# Patient Record
Sex: Female | Born: 1985 | Race: White | Hispanic: No | Marital: Single | Smoking: Current every day smoker
Health system: Southern US, Community
[De-identification: ages and names within clinical notes are randomized; demographics above are authoritative.]

---

## 2013-07-09 ENCOUNTER — Emergency Department (HOSPITAL_COMMUNITY)
Admission: EM | Admit: 2013-07-09 | Discharge: 2013-07-09 | Disposition: A | Discharge status: Home / Self Care | Payer: BC Managed Care – PPO | Attending: Emergency Medicine | Admitting: Emergency Medicine

## 2013-07-09 ENCOUNTER — Emergency Department (HOSPITAL_COMMUNITY): Payer: BC Managed Care – PPO

## 2013-07-09 ENCOUNTER — Encounter (HOSPITAL_COMMUNITY): Payer: Self-pay | Admitting: *Deleted

## 2013-07-09 DIAGNOSIS — S6990XA Unspecified injury of unspecified wrist, hand and finger(s), initial encounter: Secondary | ICD-10-CM | POA: Insufficient documentation

## 2013-07-09 DIAGNOSIS — S59909A Unspecified injury of unspecified elbow, initial encounter: Secondary | ICD-10-CM | POA: Insufficient documentation

## 2013-07-09 DIAGNOSIS — W010XXA Fall on same level from slipping, tripping and stumbling without subsequent striking against object, initial encounter: Secondary | ICD-10-CM | POA: Insufficient documentation

## 2013-07-09 DIAGNOSIS — M25522 Pain in left elbow: Secondary | ICD-10-CM

## 2013-07-09 DIAGNOSIS — Z79899 Other long term (current) drug therapy: Secondary | ICD-10-CM | POA: Insufficient documentation

## 2013-07-09 DIAGNOSIS — Y939 Activity, unspecified: Secondary | ICD-10-CM | POA: Insufficient documentation

## 2013-07-09 DIAGNOSIS — Y929 Unspecified place or not applicable: Secondary | ICD-10-CM | POA: Insufficient documentation

## 2013-07-09 MED ORDER — TRAMADOL HCL 50 MG PO TABS: 50.0000 mg | ORAL_TABLET | Freq: Four times a day (QID) | ORAL | Status: DC | PRN

## 2013-07-09 MED ORDER — KETOROLAC TROMETHAMINE 60 MG/2ML IM SOLN
60.0000 mg | Freq: Once | INTRAMUSCULAR | Status: AC
  Administered 2013-07-09: 60 mg via INTRAMUSCULAR
  Filled 2013-07-09: qty 2

## 2013-07-09 NOTE — ED Provider Notes (Signed)
Medical screening examination/treatment/procedure(s) were performed by non-physician practitioner and as supervising physician I was immediately available for consultation/collaboration.   Charles B. Sheldon, MD 07/09/13 2010 

## 2013-07-09 NOTE — ED Provider Notes (Signed)
CSN: [id number]     Arrival date & time 07/09/13  1145 History   First MD Initiated Contact with Patient 07/09/13 1313     Chief Complaint  Patient presents with  . Elbow Pain   (Consider location/radiation/quality/duration/timing/severity/associated sxs/prior Treatment) HPI Comments: Patient is a 27 year old female presented to emergency department for moderate sharp non-radiating left elbow pain that occurred Saturday evening after she slipped falling and landing on her elbow. Patient states she did immediate pain, bruising, and swelling the swelling and bruising have improved since Saturday. Patient states her elbow is continuing to bother her. States pain exacerbated with extension and flexion of arm. No alleviating factors. Denies numbness or tingling in arm.    History reviewed. No pertinent past medical history. No past surgical history on file. No family history on file. History  Substance Use Topics  . Smoking status: Not on file  . Smokeless tobacco: Not on file  . Alcohol Use: Not on file   OB History   Grav Para Term Preterm Abortions TAB SAB Ect Mult Living                 Review of Systems  Constitutional: Negative for fever and chills.  HENT: Negative for neck pain.   Musculoskeletal: Positive for myalgias, joint swelling and arthralgias.  Skin: Negative for wound.    Allergies  Review of patient's allergies indicates no known allergies.  Home Medications   Current Outpatient Rx  Name  Route  Sig  Dispense  Refill  . ibuprofen (ADVIL,MOTRIN) 200 MG tablet   Oral   Take 600 mg by mouth every 8 (eight) hours as needed for pain.         Marland Kitchen lisdexamfetamine (VYVANSE) 30 MG capsule   Oral   Take 30 mg by mouth every morning.         . traMADol (ULTRAM) 50 MG tablet   Oral   Take 1 tablet (50 mg total) by mouth every 6 (six) hours as needed for pain.   10 tablet   0    BP 90/60  Pulse 93  Temp(Src) 97.9 F (36.6 C) (Oral)  Resp 16  SpO2 100%   LMP 07/03/2013 Physical Exam  Constitutional: She is oriented to person, place, and time. She appears well-developed and well-nourished. No distress.  HENT:  Head: Normocephalic and atraumatic.  Right Ear: External ear normal.  Left Ear: External ear normal.  Nose: Nose normal.  Eyes: Conjunctivae are normal.  Neck: Neck supple.  Cardiovascular: Intact distal pulses.   Pulmonary/Chest: Effort normal.  Musculoskeletal:       Left elbow: She exhibits decreased range of motion and swelling. She exhibits no effusion, no deformity and no laceration. Tenderness found.       Left wrist: Normal.       Left forearm: Normal.       Left hand: Normal. Normal sensation noted. Normal strength noted.  Cap refill < 3 seconds  Neurological: She is alert and oriented to person, place, and time.  Skin: Skin is warm and dry. She is not diaphoretic.  Psychiatric: She has a normal mood and affect.    ED Course  Procedures (including critical care time)  Medications  ketorolac (TORADOL) injection 60 mg (60 mg Intramuscular Given 07/09/13 1351)    Labs Review Labs Reviewed - No data to display Imaging Review Dg Elbow Complete Left  07/09/2013   CLINICAL DATA:  Pain post trauma  EXAM: LEFT ELBOW - COMPLETE 3+ VIEW  COMPARISON:  None.  FINDINGS: Frontal, lateral, and bilateral oblique views were obtained. There is no fracture, dislocation, or effusion. Joint spaces appear intact. No erosive change.  IMPRESSION: No abnormality noted.   Electronically Signed   By: Bretta Bang   On: 07/09/2013 13:21    MDM   1. Left elbow pain     Afebrile, NAD, non-toxic appearing, AAOx4. Neurovascularly intact. No sensory deficit. Range of motion intact Imaging shows no fracture. Directed pt to ice injury, take acetaminophen or ibuprofen for pain, and to elevate and rest the injury when possible. RICE protocol discussed. Advised f/u in 1 week with PCP if not improving. Return precautions discussed. Patient is  agreeable to plan. Patient is stable at time of discharge       Jeannetta Ellis, PA-C 07/09/13 1836

## 2013-07-09 NOTE — ED Notes (Signed)
Pt reports she fell on Saturday and hurt her left elbow. Able to extend arm and wiggle fingers. Pain with movement 7/10.

## 2013-07-09 NOTE — Discharge Instructions (Signed)
Please follow up with your primary care physician in 1-2 days. Please take pain medication as prescribed and as needed for pain. Please do not drive on narcotic pain medication. Please follow RICE method below. Please read all discharge instructions and return precautions.   Elbow Contusion An elbow contusion is a deep bruise of the elbow. Contusions are the result of an injury that caused bleeding under the skin. The contusion may turn blue, purple, or yellow. Minor injuries will give you a painless contusion, but more severe contusions may stay painful and swollen for a few weeks.  CAUSES  An elbow contusion comes from a direct force to that area, such as falling on the elbow. SYMPTOMS   Swelling and redness of the elbow.  Bruising of the elbow area.  Tenderness or soreness of the elbow. DIAGNOSIS  You will have a physical exam and will be asked about your history. You may need an X-ray of your elbow to look for a broken bone (fracture).  TREATMENT  A sling or splint may be needed to support your injury. Resting, elevating, and applying cold compresses to the elbow area are often the best treatments for an elbow contusion. Over-the-counter medicines may also be recommended for pain control. HOME CARE INSTRUCTIONS   Put ice on the injured area.  Put ice in a plastic bag.  Place a towel between your skin and the bag.  Leave the ice on for 15-20 minutes, 3-4 times a day.  Only take over-the-counter or prescription medicines for pain, discomfort, or fever as directed by your caregiver.  Rest your injured elbow until the pain and swelling are better.  Elevate your elbow to reduce swelling.  Apply a compression wrap as directed by your caregiver. This can help reduce swelling and motion. You may remove the wrap for sleeping, showers, and baths. If your fingers become numb, cold, or blue, take the wrap off and reapply it more loosely.  Use your elbow only as directed by your caregiver.  You may be asked to do range of motion exercises. Do them as directed.  See your caregiver as directed. It is very important to keep all follow-up appointments in order to avoid any long-term problems with your elbow, including chronic pain or inability to move your elbow normally. SEEK IMMEDIATE MEDICAL CARE IF:   You have increased redness, swelling, or pain in your elbow.  Your swelling or pain is not relieved with medicines.  You have swelling of the hand and fingers.  You are unable to move your fingers or wrist.  You begin to lose feeling in your hand or fingers.  Your fingers or hand become cold or blue. MAKE SURE YOU:   Understand these instructions.  Will watch your condition.  Will get help right away if you are not doing well or get worse. Document Released: 09/19/2006 Document Revised: 01/03/2012 Document Reviewed: 08/27/2011 [facility] Patient Information 2014 [place], [place].   RICE: Routine Care for Injuries The routine care of many injuries includes Rest, Ice, Compression, and Elevation (RICE). HOME CARE INSTRUCTIONS  Rest is needed to allow your body to heal. Routine activities can usually be resumed when comfortable. Injured tendons and bones can take up to 6 weeks to heal. Tendons are the cord-like structures that attach muscle to bone.  Ice following an injury helps keep the swelling down and reduces pain.  Put ice in a plastic bag.  Place a towel between your skin and the bag.  Leave the ice on  for 15-20 minutes, 3-4 times a day. Do this while awake, for the first 24 to 48 hours. After that, continue as directed by your caregiver.  Compression helps keep swelling down. It also gives support and helps with discomfort. If an elastic bandage has been applied, it should be removed and reapplied every 3 to 4 hours. It should not be applied tightly, but firmly enough to keep swelling down. Watch fingers or toes for swelling, bluish discoloration, coldness,  numbness, or excessive pain. If any of these problems occur, remove the bandage and reapply loosely. Contact your caregiver if these problems continue.  Elevation helps reduce swelling and decreases pain. With extremities, such as the arms, hands, legs, and feet, the injured area should be placed near or above the level of the heart, if possible. SEEK IMMEDIATE MEDICAL CARE IF:  You have persistent pain and swelling.  You develop redness, numbness, or unexpected weakness.  Your symptoms are getting worse rather than improving after several days. These symptoms may indicate that further evaluation or further X-rays are needed. Sometimes, X-rays may not show a small broken bone (fracture) until 1 week or 10 days later. Make a follow-up appointment with your caregiver. Ask when your X-ray results will be ready. Make sure you get your X-ray results. Document Released: 01/23/2001 Document Revised: 01/03/2012 Document Reviewed: 03/12/2011 [facility] Patient Information 2014 [place], [place].

## 2013-07-09 NOTE — Progress Notes (Signed)
Pcp is [facility] on [address] [place] including Dr Janace Litten, scott, reade etc EPIC updated

## 2014-07-26 IMAGING — CR DG ELBOW COMPLETE 3+V*L*
4 series · 4 of 4 positions shown · non-contrast
Comparison: None.

CLINICAL DATA: Pain post trauma

EXAM:
LEFT ELBOW - COMPLETE 3+ VIEW

[x elbow lat left]
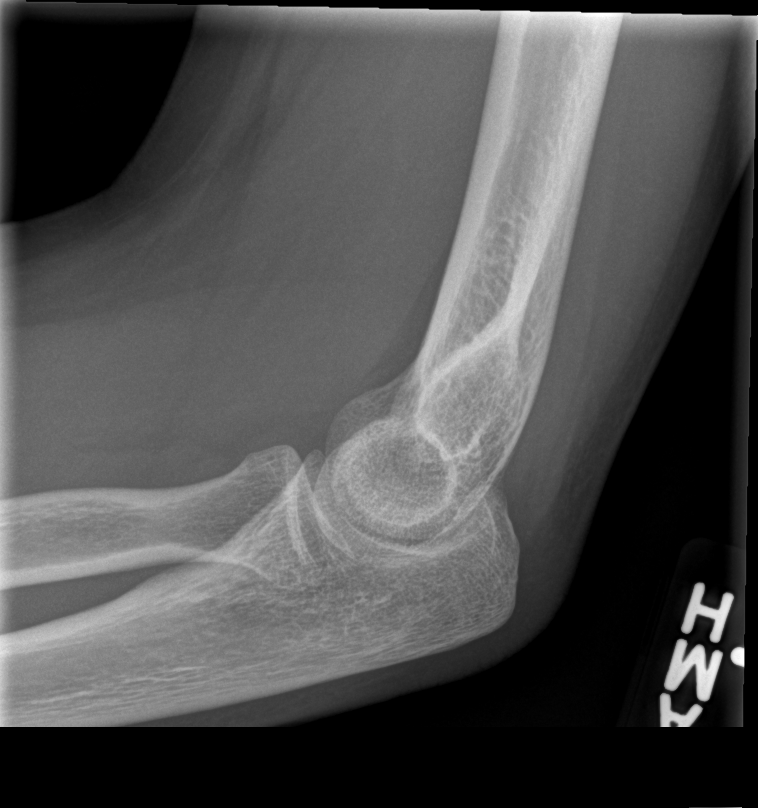

[x elbow ap left]
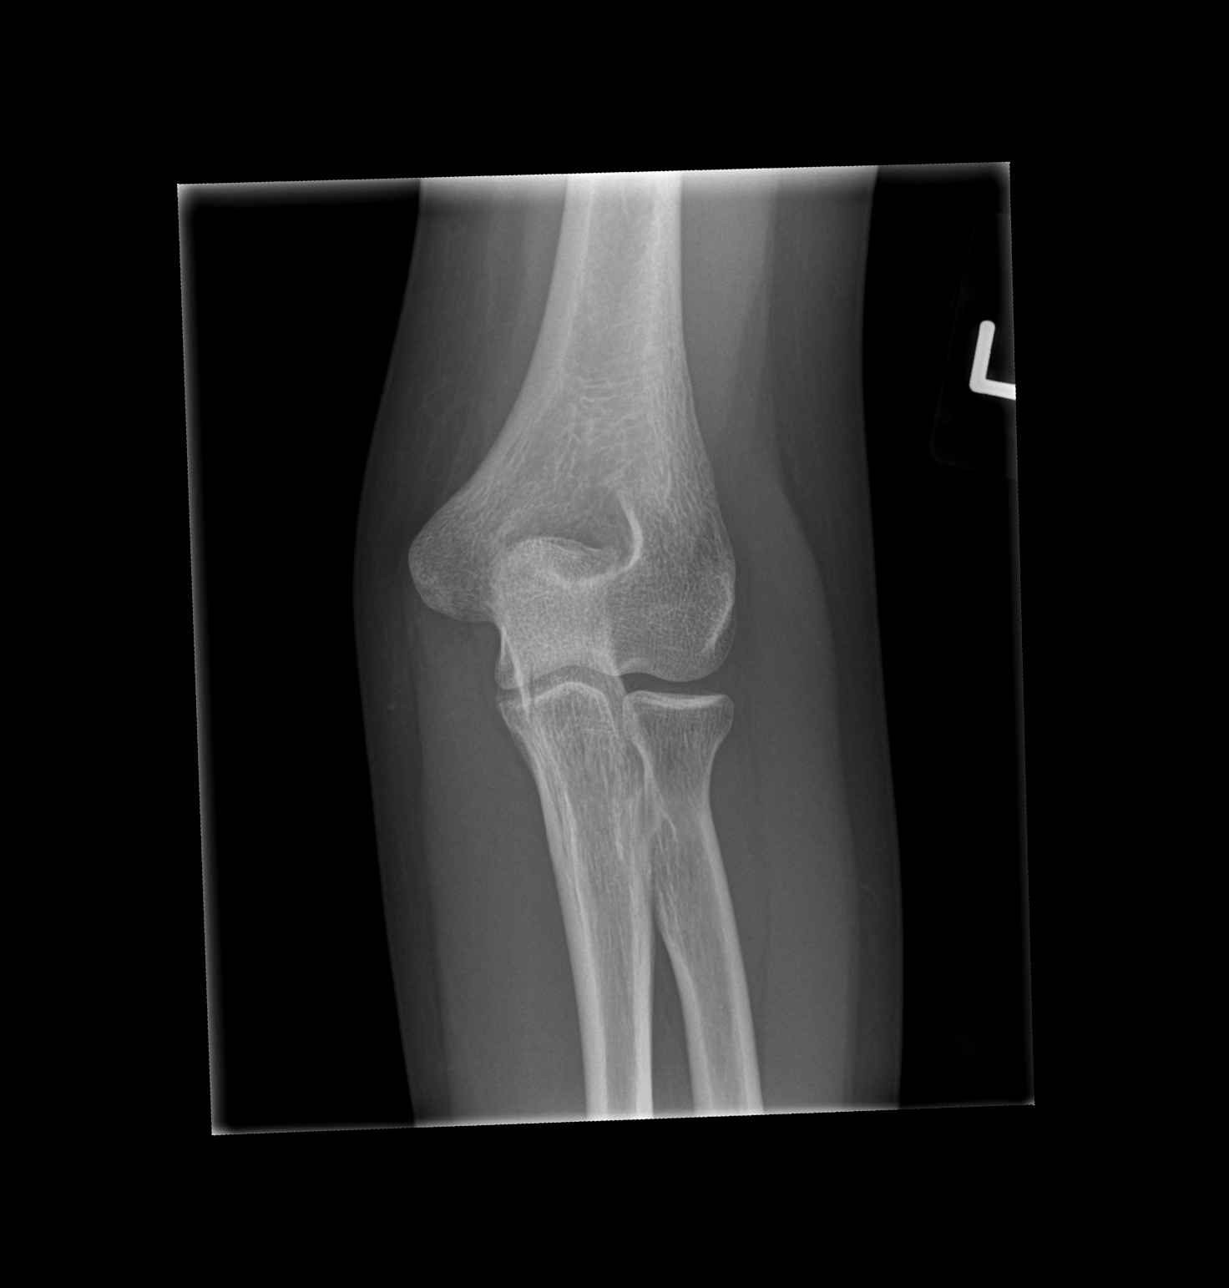

[x elbow obl left (1 of 2)]
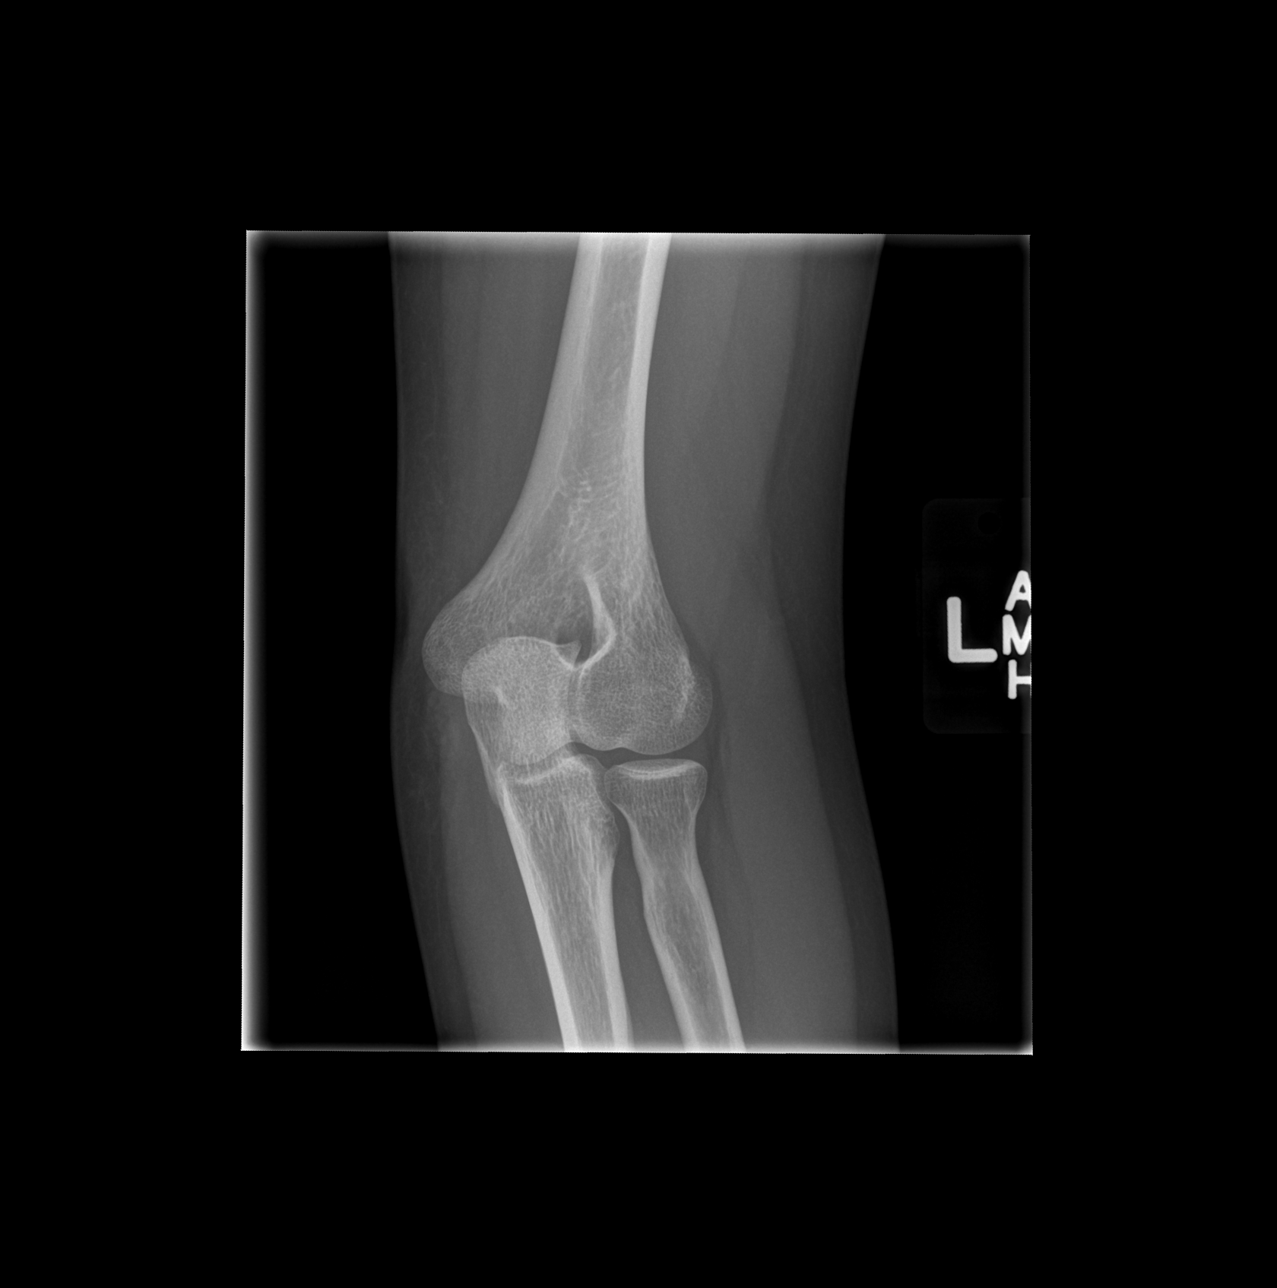

[x elbow obl left (2 of 2)]
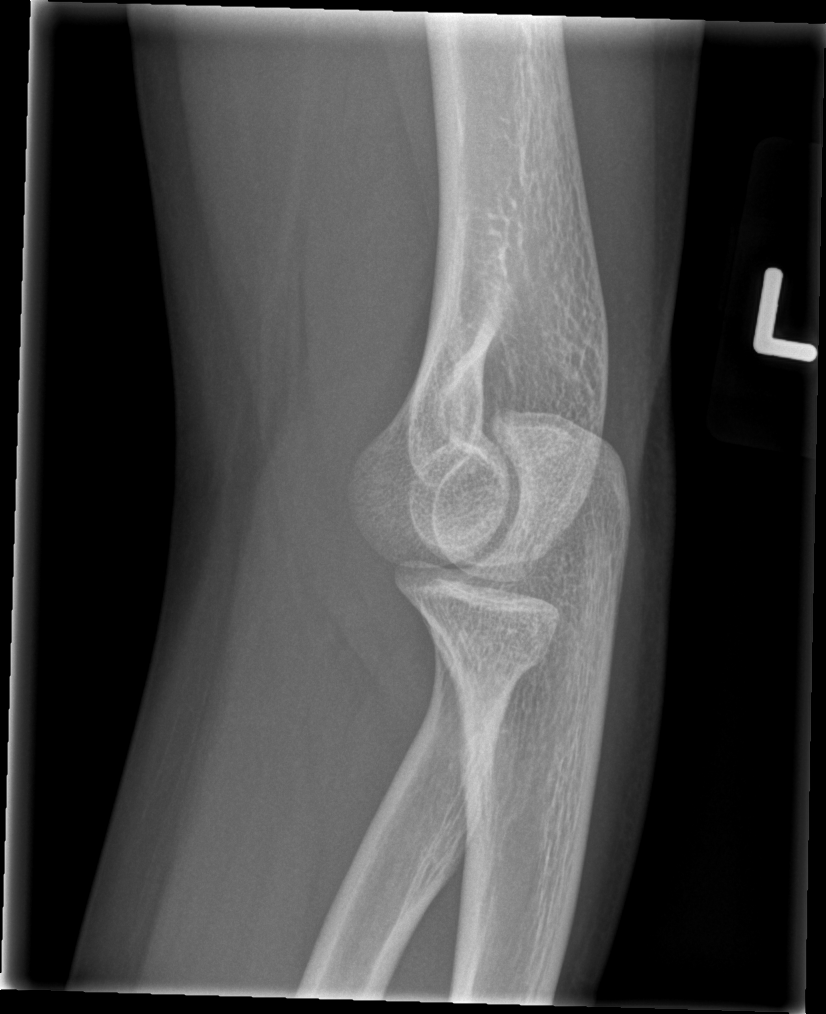

[4 of 4 positions shown; findings below may reference images not displayed]

FINDINGS: Frontal, lateral, and bilateral oblique views were obtained. There
is no fracture, dislocation, or effusion. Joint spaces appear
intact. No erosive change.
IMPRESSION: No abnormality noted.

## 2015-11-19 ENCOUNTER — Encounter: Payer: Self-pay | Admitting: Women's Health

## 2015-11-19 ENCOUNTER — Ambulatory Visit (INDEPENDENT_AMBULATORY_CARE_PROVIDER_SITE_OTHER): Payer: BLUE CROSS/BLUE SHIELD | Admitting: Women's Health

## 2015-11-19 ENCOUNTER — Other Ambulatory Visit (HOSPITAL_COMMUNITY)
Admission: RE | Admit: 2015-11-19 | Discharge: 2015-11-19 | Disposition: A | Discharge status: Home / Self Care | Payer: BLUE CROSS/BLUE SHIELD | Source: Ambulatory Visit | Attending: Gynecology | Admitting: Gynecology

## 2015-11-19 VITALS — BP 118/80 | Ht 66.0 in | Wt 157.0 lb

## 2015-11-19 DIAGNOSIS — B009 Herpesviral infection, unspecified: Secondary | ICD-10-CM

## 2015-11-19 DIAGNOSIS — N760 Acute vaginitis: Secondary | ICD-10-CM | POA: Diagnosis not present

## 2015-11-19 DIAGNOSIS — Z01419 Encounter for gynecological examination (general) (routine) without abnormal findings: Secondary | ICD-10-CM | POA: Diagnosis not present

## 2015-11-19 DIAGNOSIS — N898 Other specified noninflammatory disorders of vagina: Secondary | ICD-10-CM | POA: Diagnosis not present

## 2015-11-19 DIAGNOSIS — A499 Bacterial infection, unspecified: Secondary | ICD-10-CM | POA: Diagnosis not present

## 2015-11-19 DIAGNOSIS — B9689 Other specified bacterial agents as the cause of diseases classified elsewhere: Secondary | ICD-10-CM

## 2015-11-19 LAB — WET PREP FOR TRICH, YEAST, CLUE
Trich, Wet Prep: NONE SEEN
Yeast Wet Prep HPF POC: NONE SEEN

## 2015-11-19 MED ORDER — VALACYCLOVIR HCL 500 MG PO TABS: ORAL_TABLET | ORAL | Status: AC

## 2015-11-19 MED ORDER — METRONIDAZOLE 500 MG PO TABS: 500.0000 mg | ORAL_TABLET | Freq: Two times a day (BID) | ORAL | Status: DC

## 2015-11-19 NOTE — Progress Notes (Signed)
Alison Day 11/16/1985 [id number]    History:    Presents for annual exam.  Monthly 5-6 day cycles for several days heavy. Has used oral contraceptives in the past would like to do something nonhormonal or an IUD. Normal Pap history. Received gardasil. One partner for past year with a negative STD screen. History of HSV 2 rare outbreaks.  Past medical history, past surgical history, family history and social history were all reviewed and documented in the EPIC chart. 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 Parents healthy.  ROS:  A ROS was performed and pertinent positives and negatives are included.  Exam:  Filed Vitals:   11/19/15 1056  BP: 118/80    General appearance:  Normal Thyroid:  Symmetrical, normal in size, without palpable masses or nodularity. Respiratory  Auscultation:  Clear without wheezing or rhonchi Cardiovascular  Auscultation:  Regular rate, without rubs, murmurs or gallops  Edema/varicosities:  Not grossly evident Abdominal  Soft,nontender, without masses, guarding or rebound.  Liver/spleen:  No organomegaly noted  Hernia:  None appreciated  Skin  Inspection:  Grossly normal numerous tattoos   Breasts: Examined lying and sitting.     Right: Without masses, retractions, discharge or axillary adenopathy.     Left: Without masses, retractions, discharge or axillary adenopathy. Gentitourinary   Inguinal/mons:  Normal without inguinal adenopathy  External genitalia:  Normal  BUS/Urethra/Skene's glands:  Normal  Vagina:  White discharge wet prep positive for moderate clues, TNTC bacteria  Cervix:  Normal  Uterus:  normal in size, shape and contour.  Midline and mobile  Adnexa/parametria:     Rt: Without masses or tenderness.   Lt: Without masses or tenderness.  Anus and perineum: Normal  Digital rectal exam: Normal sphincter tone without palpated masses or tenderness  Assessment/Plan:  30 y.o. S WF G1 P0 for  annual exam.   Contraception management HSV-2 history Bacteria vaginosis  Plan: Contraception options reviewed, would like to try Mirena IUD, will check coverage, schedule with next cycle with Dr. Lily Peer for placement. Reviewed slight risks of infection, perforation and hemorrhage. Continue condoms until IUD placed. Valtrex 500 twice daily for 3-5 days when necessary prescription, proper use given and reviewed. Flagyl 500 twice daily for 7 days, alcohol precautions reviewed. Instructed to call if no relief of discharge. SBE's, regular exercise, calcium rich diet, MVI daily encouraged. At this time desires no children. CBC, UA, Pap.    Harrington Challenger [facility], 11:48 AM 11/19/2015

## 2015-11-19 NOTE — Addendum Note (Signed)
Addended by: Kem Parkinson on: 11/19/2015 12:11 PM   Modules accepted: Orders

## 2015-11-19 NOTE — Patient Instructions (Signed)
Levonorgestrel intrauterine device (IUD) What is this medicine? LEVONORGESTREL IUD (LEE voe nor jes trel) is a contraceptive (birth control) device. The device is placed inside the uterus by a healthcare professional. It is used to prevent pregnancy and can also be used to treat heavy bleeding that occurs during your period. Depending on the device, it can be used for 3 to 5 years. This medicine may be used for other purposes; ask your health care provider or pharmacist if you have questions. What should I tell my health care provider before I take this medicine? They need to know if you have any of these conditions: -abnormal Pap smear -cancer of the breast, uterus, or cervix -diabetes -endometritis -genital or pelvic infection now or in the past -have more than one sexual partner or your partner has more than one partner -heart disease -history of an ectopic or tubal pregnancy -immune system problems -IUD in place -liver disease or tumor -problems with blood clots or take blood-thinners -use intravenous drugs -uterus of unusual shape -vaginal bleeding that has not been explained -an unusual or allergic reaction to levonorgestrel, other hormones, silicone, or polyethylene, medicines, foods, dyes, or preservatives -pregnant or trying to get pregnant -breast-feeding How should I use this medicine? This device is placed inside the uterus by a health care professional. Talk to your pediatrician regarding the use of this medicine in children. Special care may be needed. Overdosage: If you think you have taken too much of this medicine contact a poison control center or emergency room at once. NOTE: This medicine is only for you. Do not share this medicine with others. What if I miss a dose? This does not apply. What may interact with this medicine? Do not take this medicine with any of the following medications: -amprenavir -bosentan -fosamprenavir This medicine may also interact with  the following medications: -aprepitant -barbiturate medicines for inducing sleep or treating seizures -bexarotene -griseofulvin -medicines to treat seizures like carbamazepine, ethotoin, felbamate, oxcarbazepine, phenytoin, topiramate -modafinil -pioglitazone -rifabutin -rifampin -rifapentine -some medicines to treat HIV infection like atazanavir, indinavir, lopinavir, nelfinavir, tipranavir, ritonavir -St. John's wort -warfarin This list may not describe all possible interactions. Give your health care provider a list of all the medicines, herbs, non-prescription drugs, or dietary supplements you use. Also tell them if you smoke, drink alcohol, or use illegal drugs. Some items may interact with your medicine. What should I watch for while using this medicine? Visit your doctor or health care professional for regular check ups. See your doctor if you or your partner has sexual contact with others, becomes HIV positive, or gets a sexual transmitted disease. This product does not protect you against HIV infection (AIDS) or other sexually transmitted diseases. You can check the placement of the IUD yourself by reaching up to the top of your vagina with clean fingers to feel the threads. Do not pull on the threads. It is a good habit to check placement after each menstrual period. Call your doctor right away if you feel more of the IUD than just the threads or if you cannot feel the threads at all. The IUD may come out by itself. You may become pregnant if the device comes out. If you notice that the IUD has come out use a backup birth control method like condoms and call your health care provider. Using tampons will not change the position of the IUD and are okay to use during your period. What side effects may I notice from receiving this medicine?  Side effects that you should report to your doctor or health care professional as soon as possible: -allergic reactions like skin rash, itching or  hives, swelling of the face, lips, or tongue -fever, flu-like symptoms -genital sores -high blood pressure -no menstrual period for 6 weeks during use -pain, swelling, warmth in the leg -pelvic pain or tenderness -severe or sudden headache -signs of pregnancy -stomach cramping -sudden shortness of breath -trouble with balance, talking, or walking -unusual vaginal bleeding, discharge -yellowing of the eyes or skin Side effects that usually do not require medical attention (report to your doctor or health care professional if they continue or are bothersome): -acne -breast pain -change in sex drive or performance -changes in weight -cramping, dizziness, or faintness while the device is being inserted -headache -irregular menstrual bleeding within first 3 to 6 months of use -nausea This list may not describe all possible side effects. Call your doctor for medical advice about side effects. You may report side effects to FDA at 1-800-FDA-1088. Where should I keep my medicine? This does not apply. NOTE: This sheet is a summary. It may not cover all possible information. If you have questions about this medicine, talk to your doctor, pharmacist, or health care provider.    2016, Elsevier/Gold Standard. (2011-11-11 13:54:04) Health Maintenance, Female Adopting a healthy lifestyle and getting preventive care can go a long way to promote health and wellness. Talk with your health care provider about what schedule of regular examinations is right for you. This is a good chance for you to check in with your provider about disease prevention and staying healthy. In between checkups, there are plenty of things you can do on your own. Experts have done a lot of research about which lifestyle changes and preventive measures are most likely to keep you healthy. Ask your health care provider for more information. WEIGHT AND DIET  Eat a healthy diet  Be sure to include plenty of vegetables, fruits,  low-fat dairy products, and lean protein.  Do not eat a lot of foods high in solid fats, added sugars, or salt.  Get regular exercise. This is one of the most important things you can do for your health.  Most adults should exercise for at least 150 minutes each week. The exercise should increase your heart rate and make you sweat (moderate-intensity exercise).  Most adults should also do strengthening exercises at least twice a week. This is in addition to the moderate-intensity exercise.  Maintain a healthy weight  Body mass index (BMI) is a measurement that can be used to identify possible weight problems. It estimates body fat based on height and weight. Your health care provider can help determine your BMI and help you achieve or maintain a healthy weight.  For females 30 years of age and older:   A BMI below 18.5 is considered underweight.  A BMI of 18.5 to 24.9 is normal.  A BMI of 25 to 29.9 is considered overweight.  A BMI of 30 and above is considered obese.  Watch levels of cholesterol and blood lipids  You should start having your blood tested for lipids and cholesterol at 28 years of age, then have this test every 5 years.  You may need to have your cholesterol levels checked more often if:  Your lipid or cholesterol levels are high.  You are older than 28 years of age.  You are at high risk for heart disease.  CANCER SCREENING   Lung Cancer  Lung cancer  screening is recommended for adults 58-66 years old who are at high risk for lung cancer because of a history of smoking.  A yearly low-dose CT scan of the lungs is recommended for people who:  Currently smoke.  Have quit within the past 15 years.  Have at least a 30-pack-year history of smoking. A pack year is smoking an average of one pack of cigarettes a day for 1 year.  Yearly screening should continue until it has been 15 years since you quit.  Yearly screening should stop if you develop a  health problem that would prevent you from having lung cancer treatment.  Breast Cancer  Practice breast self-awareness. This means understanding how your breasts normally appear and feel.  It also means doing regular breast self-exams. Let your health care provider know about any changes, no matter how small.  If you are in your 20s or 30s, you should have a clinical breast exam (CBE) by a health care provider every 1-3 years as part of a regular health exam.  If you are 16 or older, have a CBE every year. Also consider having a breast X-ray (mammogram) every year.  If you have a family history of breast cancer, talk to your health care provider about genetic screening.  If you are at high risk for breast cancer, talk to your health care provider about having an MRI and a mammogram every year.  Breast cancer gene (BRCA) assessment is recommended for women who have family members with BRCA-related cancers. BRCA-related cancers include:  Breast.  Ovarian.  Tubal.  Peritoneal cancers.  Results of the assessment will determine the need for genetic counseling and BRCA1 and BRCA2 testing. Cervical Cancer Your health care provider may recommend that you be screened regularly for cancer of the pelvic organs (ovaries, uterus, and vagina). This screening involves a pelvic examination, including checking for microscopic changes to the surface of your cervix (Pap test). You may be encouraged to have this screening done every 3 years, beginning at age 9.  For women ages 50-65, health care providers may recommend pelvic exams and Pap testing every 3 years, or they may recommend the Pap and pelvic exam, combined with testing for human papilloma virus (HPV), every 5 years. Some types of HPV increase your risk of cervical cancer. Testing for HPV may also be done on women of any age with unclear Pap test results.  Other health care providers may not recommend any screening for nonpregnant women who  are considered low risk for pelvic cancer and who do not have symptoms. Ask your health care provider if a screening pelvic exam is right for you.  If you have had past treatment for cervical cancer or a condition that could lead to cancer, you need Pap tests and screening for cancer for at least 20 years after your treatment. If Pap tests have been discontinued, your risk factors (such as having a new sexual partner) need to be reassessed to determine if screening should resume. Some women have medical problems that increase the chance of getting cervical cancer. In these cases, your health care provider may recommend more frequent screening and Pap tests. Colorectal Cancer  This type of cancer can be detected and often prevented.  Routine colorectal cancer screening usually begins at 28 years of age and continues through 28 years of age.  Your health care provider may recommend screening at an earlier age if you have risk factors for colon cancer.  Your health care provider may  also recommend using home test kits to check for hidden blood in the stool.  A small camera at the end of a tube can be used to examine your colon directly (sigmoidoscopy or colonoscopy). This is done to check for the earliest forms of colorectal cancer.  Routine screening usually begins at age 30.  Direct examination of the colon should be repeated every 5-10 years through 28 years of age. However, you may need to be screened more often if early forms of precancerous polyps or small growths are found. Skin Cancer  Check your skin from head to toe regularly.  Tell your health care provider about any new moles or changes in moles, especially if there is a change in a mole's shape or color.  Also tell your health care provider if you have a mole that is larger than the size of a pencil eraser.  Always use sunscreen. Apply sunscreen liberally and repeatedly throughout the day.  Protect yourself by wearing long  sleeves, pants, a wide-brimmed hat, and sunglasses whenever you are outside. HEART DISEASE, DIABETES, AND HIGH BLOOD PRESSURE   High blood pressure causes heart disease and increases the risk of stroke. High blood pressure is more likely to develop in:  People who have blood pressure in the high end of the normal range (130-139/85-89 mm Hg).  People who are overweight or obese.  People who are African American.  If you are 42-67 years of age, have your blood pressure checked every 3-5 years. If you are 37 years of age or older, have your blood pressure checked every year. You should have your blood pressure measured twice--once when you are at a hospital or clinic, and once when you are not at a hospital or clinic. Record the average of the two measurements. To check your blood pressure when you are not at a hospital or clinic, you can use:  An automated blood pressure machine at a pharmacy.  A home blood pressure monitor.  If you are between 20 years and 21 years old, ask your health care provider if you should take aspirin to prevent strokes.  Have regular diabetes screenings. This involves taking a blood sample to check your fasting blood sugar level.  If you are at a normal weight and have a low risk for diabetes, have this test once every three years after 28 years of age.  If you are overweight and have a high risk for diabetes, consider being tested at a younger age or more often. PREVENTING INFECTION  Hepatitis B  If you have a higher risk for hepatitis B, you should be screened for this virus. You are considered at high risk for hepatitis B if:  You were born in a country where hepatitis B is common. Ask your health care provider which countries are considered high risk.  Your parents were born in a high-risk country, and you have not been immunized against hepatitis B (hepatitis B vaccine).  You have HIV or AIDS.  You use needles to inject street drugs.  You live with  someone who has hepatitis B.  You have had sex with someone who has hepatitis B.  You get hemodialysis treatment.  You take certain medicines for conditions, including cancer, organ transplantation, and autoimmune conditions. Hepatitis C  Blood testing is recommended for:  Everyone born from [birth year] through 1963.  Anyone with known risk factors for hepatitis C. Sexually transmitted infections (STIs)  You should be screened for sexually transmitted infections (STIs) including gonorrhea  and chlamydia if:  You are sexually active and are younger than 28 years of age.  You are older than 28 years of age and your health care provider tells you that you are at risk for this type of infection.  Your sexual activity has changed since you were last screened and you are at an increased risk for chlamydia or gonorrhea. Ask your health care provider if you are at risk.  If you do not have HIV, but are at risk, it may be recommended that you take a prescription medicine daily to prevent HIV infection. This is called pre-exposure prophylaxis (PrEP). You are considered at risk if:  You are sexually active and do not regularly use condoms or know the HIV status of your partner(s).  You take drugs by injection.  You are sexually active with a partner who has HIV. Talk with your health care provider about whether you are at high risk of being infected with HIV. If you choose to begin PrEP, you should first be tested for HIV. You should then be tested every 3 months for as long as you are taking PrEP.  PREGNANCY   If you are premenopausal and you may become pregnant, ask your health care provider about preconception counseling.  If you may become pregnant, take 400 to 800 micrograms (mcg) of folic acid every day.  If you want to prevent pregnancy, talk to your health care provider about birth control (contraception). OSTEOPOROSIS AND MENOPAUSE   Osteoporosis is a disease in which the bones lose  minerals and strength with aging. This can result in serious bone fractures. Your risk for osteoporosis can be identified using a bone density scan.  If you are 42 years of age or older, or if you are at risk for osteoporosis and fractures, ask your health care provider if you should be screened.  Ask your health care provider whether you should take a calcium or vitamin D supplement to lower your risk for osteoporosis.  Menopause may have certain physical symptoms and risks.  Hormone replacement therapy may reduce some of these symptoms and risks. Talk to your health care provider about whether hormone replacement therapy is right for you.  HOME CARE INSTRUCTIONS   Schedule regular health, dental, and eye exams.  Stay current with your immunizations.   Do not use any tobacco products including cigarettes, chewing tobacco, or electronic cigarettes.  If you are pregnant, do not drink alcohol.  If you are breastfeeding, limit how much and how often you drink alcohol.  Limit alcohol intake to no more than 1 drink per day for nonpregnant women. One drink equals 12 ounces of beer, 5 ounces of wine, or 1 ounces of hard liquor.  Do not use street drugs.  Do not share needles.  Ask your health care provider for help if you need support or information about quitting drugs.  Tell your health care provider if you often feel depressed.  Tell your health care provider if you have ever been abused or do not feel safe at home.   This information is not intended to replace advice given to you by your health care provider. Make sure you discuss any questions you have with your health care provider.   Document Released: 04/26/2011 Document Revised: 11/01/2014 Document Reviewed: 09/12/2013 Elsevier Interactive Patient Education [employer].

## 2015-11-20 LAB — URINALYSIS W MICROSCOPIC + REFLEX CULTURE
BACTERIA UA: NONE SEEN [HPF]
BILIRUBIN URINE: NEGATIVE
CASTS: NONE SEEN [LPF]
CRYSTALS: NONE SEEN [HPF]
Glucose, UA: NEGATIVE
HGB URINE DIPSTICK: NEGATIVE
KETONES UR: NEGATIVE
Leukocytes, UA: NEGATIVE
Nitrite: NEGATIVE
PROTEIN: NEGATIVE
RBC / HPF: NONE SEEN RBC/HPF (ref <=2)
SPECIFIC GRAVITY, URINE: 1.012 (ref 1.001–1.035)
WBC UA: NONE SEEN WBC/HPF (ref <=5)
Yeast: NONE SEEN [HPF]
pH: 5.5 (ref 5.0–8.0)

## 2015-11-21 ENCOUNTER — Telehealth: Payer: Self-pay | Admitting: Gynecology

## 2015-11-21 NOTE — Telephone Encounter (Signed)
11/21/15-I spoke with pt to let her know that her [facility] ins will cover the Mirena for contraception at 100%, no copay. [email or web address] [phone]. She was advised to call first day of cycle to schedule with JF/wl

## 2015-11-24 LAB — CYTOLOGY - PAP

## 2015-12-30 ENCOUNTER — Encounter: Payer: Self-pay | Admitting: Gynecology

## 2015-12-30 ENCOUNTER — Ambulatory Visit (INDEPENDENT_AMBULATORY_CARE_PROVIDER_SITE_OTHER): Payer: BLUE CROSS/BLUE SHIELD | Admitting: Gynecology

## 2015-12-30 VITALS — BP 118/76

## 2015-12-30 DIAGNOSIS — Z30430 Encounter for insertion of intrauterine contraceptive device: Secondary | ICD-10-CM | POA: Diagnosis not present

## 2015-12-30 HISTORY — PX: INTRAUTERINE DEVICE INSERTION: SHX323

## 2015-12-30 NOTE — Patient Instructions (Signed)
Intrauterine Device Insertion Most often, an intrauterine device (IUD) is inserted into the uterus to prevent pregnancy. There are 2 types of IUDs available:  Copper IUD--This type of IUD creates an environment that is not favorable to sperm survival. The mechanism of action of the copper IUD is not known for certain. It can stay in place for 10 years.  Hormone IUD--This type of IUD contains the hormone progestin (synthetic progesterone). The progestin thickens the cervical mucus and prevents sperm from entering the uterus, and it also thins the uterine lining. There is no evidence that the hormone IUD prevents implantation. One hormone IUD can stay in place for up to 5 years, and a different hormone IUD can stay in place for up to 3 years. An IUD is the most cost-effective birth control if left in place for the full duration. It may be removed at any time. LET YOUR HEALTH CARE PROVIDER KNOW ABOUT:  Any allergies you have.  All medicines you are taking, including vitamins, herbs, eye drops, creams, and over-the-counter medicines.  Previous problems you or members of your family have had with the use of anesthetics.  Any blood disorders you have.  Previous surgeries you have had.  Possibility of pregnancy.  Medical conditions you have. RISKS AND COMPLICATIONS  Generally, intrauterine device insertion is a safe procedure. However, as with any procedure, complications can occur. Possible complications include:  Accidental puncture (perforation) of the uterus.  Accidental placement of the IUD either in the muscle layer of the uterus (myometrium) or outside the uterus. If this happens, the IUD can be found essentially floating around the bowels and must be taken out surgically.  The IUD may fall out of the uterus (expulsion). This is more common in women who have recently had a child.   Pregnancy in the fallopian tube (ectopic).  Pelvic inflammatory disease (PID), which is infection of  the uterus and fallopian tubes. The risk of PID is slightly increased in the first 20 days after the IUD is placed, but the overall risk is still very low. BEFORE THE PROCEDURE  Schedule the IUD insertion for when you will have your menstrual period or right after, to make sure you are not pregnant. Placement of the IUD is better tolerated shortly after a menstrual cycle.  You may need to take tests or be examined to make sure you are not pregnant.  You may be required to take a pregnancy test.  You may be required to get checked for sexually transmitted infections (STIs) prior to placement. Placing an IUD in someone who has an infection can make the infection worse.  You may be given a pain reliever to take 1 or 2 hours before the procedure.  An exam will be performed to determine the size and position of your uterus.  Ask your health care provider about changing or stopping your regular medicines. PROCEDURE   A tool (speculum) is placed in the vagina. This allows your health care provider to see the lower part of the uterus (cervix).  The cervix is prepped with a medicine that lowers the risk of infection.  You may be given a medicine to numb each side of the cervix (intracervical or paracervical block). This is used to block and control any discomfort with insertion.  A tool (uterine sound) is inserted into the uterus to determine the length of the uterine cavity and the direction the uterus may be tilted.  A slim instrument (IUD inserter) is inserted through the cervical   canal and into your uterus.  The IUD is placed in the uterine cavity and the insertion device is removed.  The nylon string that is attached to the IUD and used for eventual IUD removal is trimmed. It is trimmed so that it lays high in the vagina, just outside the cervix. AFTER THE PROCEDURE  You may have bleeding after the procedure. This is normal. It varies from light spotting for a few days to menstrual-like  bleeding.  You may have mild cramping.   This information is not intended to replace advice given to you by your health care provider. Make sure you discuss any questions you have with your health care provider.   Document Released: 06/09/2011 Document Revised: 08/01/2013 Document Reviewed: 04/01/2013 Elsevier Interactive Patient Education 2016 Elsevier Inc.  

## 2015-12-30 NOTE — Progress Notes (Signed)
Patient presents for Mirena IUD placement. As previously discussed this with Alison Day.  She has read through the booklet, has no contraindications and signed the consent form. She currently is on a normal menses.  I reviewed the insertional process with her as well as the risks to include infection, either immediate or long-term, uterine perforation or migration requiring surgery to remove, other complications such as pain, hormonal side effects, infertility and possibility of failure with subsequent pregnancy.   [facility] External BUS vagina normal. Cervix normal with light menses flow. Uterus anteverted normal size shape contour midline mobile nontender. Adnexa without masses or tenderness.  Procedure: The cervix was cleansed with Betadine, anterior lip grasped with a single-tooth tenaculum, the uterus was sounded and a Mirena IUD was placed according to manufacturer's recommendations without difficulty. The strings were trimmed. The patient tolerated well and will follow up in one month for a postinsertional check.  Lot number:  [id number]    Dara LordsFONTAINE,Alison Day P MD, 9:48 AM 12/30/2015

## 2015-12-31 ENCOUNTER — Telehealth: Payer: Self-pay | Admitting: *Deleted

## 2015-12-31 MED ORDER — IBUPROFEN 800 MG PO TABS
800.0000 mg | ORAL_TABLET | Freq: Three times a day (TID) | ORAL | Status: AC | PRN
Start: 2015-12-31 — End: ?

## 2015-12-31 NOTE — Telephone Encounter (Signed)
Prescription strength ibuprofen 800 mg every 8 our when necessary #30

## 2015-12-31 NOTE — Telephone Encounter (Signed)
Pt had Mirena IUD placed yesterday c/o cramping taking OTC Advil and no relief, she asked if you could prescribe something a little stronger Rx? Please advise

## 2015-12-31 NOTE — Telephone Encounter (Signed)
Pt informed with the below note, Rx sent. 

## 2016-01-27 ENCOUNTER — Telehealth: Payer: Self-pay | Admitting: *Deleted

## 2016-01-27 NOTE — Telephone Encounter (Signed)
Day or 2.

## 2016-01-27 NOTE — Telephone Encounter (Signed)
Pt informed with the below note. 

## 2016-01-27 NOTE — Telephone Encounter (Signed)
Pt called had Mirena IUD placed on 12/30/15 started a cycle today medium flow, still having cramping, pt asked how long could she possibility except cramping? She has Rx for ibuprofen 800 mg which helps. Please advise

## 2016-01-28 ENCOUNTER — Ambulatory Visit: Payer: BLUE CROSS/BLUE SHIELD | Admitting: Gynecology

## 2016-02-02 ENCOUNTER — Ambulatory Visit: Payer: BLUE CROSS/BLUE SHIELD | Admitting: Gynecology

## 2016-02-05 ENCOUNTER — Encounter: Payer: Self-pay | Admitting: Gynecology

## 2016-02-05 ENCOUNTER — Ambulatory Visit (INDEPENDENT_AMBULATORY_CARE_PROVIDER_SITE_OTHER): Payer: BLUE CROSS/BLUE SHIELD | Admitting: Gynecology

## 2016-02-05 VITALS — BP 116/74

## 2016-02-05 DIAGNOSIS — Z30431 Encounter for routine checking of intrauterine contraceptive device: Secondary | ICD-10-CM

## 2016-02-05 MED ORDER — MEGESTROL ACETATE 20 MG PO TABS: 20.0000 mg | ORAL_TABLET | Freq: Every day | ORAL | Status: AC

## 2016-02-05 NOTE — Progress Notes (Signed)
    Alison Day 02/26/1986 [id number]        30 y.o.  G1P0010 Presents for follow up IUD check. Had Mirena IUD placed 12/30/2015. Does have some spotting and cramping intermittently. Overall is getting better but still occurring.  Past medical history,surgical history, problem list, medications, allergies, family history and social history were all reviewed and documented in the EPIC chart.  Directed ROS with pertinent positives and negatives documented in the history of present illness/assessment and plan.  Exam: Alison Day assistant Filed Vitals:   02/05/16 1100  BP: 116/74   General appearance:  Normal Abdomen soft nontender without masses guarding rebound Pelvic external BUS vagina normal. Cervix normal with IUD string visualized an appropriate length.  Uterus normal size midline mobile nontender. Adnexa without masses or tenderness.  Assessment/Plan:  30 y.o. G1P0010 with normal IUD follow up exam. Is still having some cramping and bleeding. We'll treat with Megace 20 mg daily 14 days to hopefully accelerate the thinning of the endometrium and eliminated the bleeding/cramping. Patient will follow up if this continues. Otherwise she will follow up in January/February 2018 when she is due for her annual exam.    Dara LordsFONTAINE,Amelia Macken P MD, 11:08 AM 02/05/2016

## 2016-02-05 NOTE — Patient Instructions (Signed)
Take the Megace pill daily for 2 weeks. Follow up if your regular bleeding and/or pain continues. Otherwise follow up in January/February 2018 when you're due for your annual exam.

## 2017-03-09 ENCOUNTER — Encounter: Payer: Self-pay | Admitting: Gynecology

## 2019-07-16 ENCOUNTER — Encounter: Payer: Self-pay | Admitting: Gynecology

## 2020-09-29 ENCOUNTER — Ambulatory Visit (INDEPENDENT_AMBULATORY_CARE_PROVIDER_SITE_OTHER): Payer: Self-pay

## 2020-10-11 ENCOUNTER — Ambulatory Visit (HOSPITAL_COMMUNITY): Payer: Self-pay

## 2020-10-11 ENCOUNTER — Ambulatory Visit: Payer: Commercial Managed Care - HMO | Attending: Internal Medicine

## 2020-10-11 DIAGNOSIS — Z20822 Contact with and (suspected) exposure to covid-19: Secondary | ICD-10-CM | POA: Insufficient documentation

## 2020-10-11 NOTE — Progress Notes (Signed)
Patient was seen today at the [facility] COVID-19 testing site where a dual collection sample was taken from the anterior nares. The specimen was sent to the [facility] lab for COVID-19 testing. Results will be available within 48 hours. Patient received informational instructions on self-care. The specimen was collected by designated RN/LPN/MA per standing order.

## 2020-10-12 LAB — COVID-19 CORONAVIRUS QUALITATIVE PCR: COVID-19 Coronavirus Qual PCR Result: NOT DETECTED
